# Patient Record
Sex: Female | Born: 1970 | Race: White | Hispanic: No | Marital: Married | Smoking: Never smoker
Health system: Southern US, Community
[De-identification: ages and names within clinical notes are randomized; demographics above are authoritative.]

---

## 2020-02-22 ENCOUNTER — Ambulatory Visit: Payer: Self-pay | Attending: Internal Medicine

## 2020-02-22 DIAGNOSIS — Z23 Encounter for immunization: Secondary | ICD-10-CM | POA: Insufficient documentation

## 2020-02-22 NOTE — Progress Notes (Signed)
   Covid-19 Vaccination Clinic  Name:  Kamri Gotsch    MRN: [id number] DOB: [birth year]  02/22/2020  Ms. Ellerbrock was observed post Covid-19 immunization for 15 minutes without incident. She was provided with Vaccine Information Sheet and instruction to access the V-Safe system.   Ms. Kinzie was instructed to call 911 with any severe reactions post vaccine: Marland Kitchen Difficulty breathing  . Swelling of face and throat  . A fast heartbeat  . A bad rash all over body  . Dizziness and weakness   Immunizations Administered    Name Date Dose VIS Date Route   Pfizer COVID-19 Vaccine 02/22/2020 12:16 PM 0.3 mL 11/28/2019 Intramuscular   Manufacturer: [employer], [employer]   Lot: [id number]   NDC: [address]-2

## 2020-03-14 ENCOUNTER — Ambulatory Visit: Payer: Self-pay

## 2021-01-20 ENCOUNTER — Emergency Department (HOSPITAL_COMMUNITY): Payer: BC Managed Care – PPO

## 2021-01-20 ENCOUNTER — Encounter (HOSPITAL_COMMUNITY): Payer: Self-pay | Admitting: Emergency Medicine

## 2021-01-20 ENCOUNTER — Emergency Department (HOSPITAL_COMMUNITY)
Admission: EM | Admit: 2021-01-20 | Discharge: 2021-01-20 | Disposition: A | Discharge status: Home / Self Care | Payer: BC Managed Care – PPO | Attending: Emergency Medicine | Admitting: Emergency Medicine

## 2021-01-20 DIAGNOSIS — R109 Unspecified abdominal pain: Secondary | ICD-10-CM | POA: Diagnosis not present

## 2021-01-20 DIAGNOSIS — R079 Chest pain, unspecified: Secondary | ICD-10-CM

## 2021-01-20 DIAGNOSIS — R110 Nausea: Secondary | ICD-10-CM | POA: Diagnosis not present

## 2021-01-20 DIAGNOSIS — R61 Generalized hyperhidrosis: Secondary | ICD-10-CM | POA: Insufficient documentation

## 2021-01-20 DIAGNOSIS — R0602 Shortness of breath: Secondary | ICD-10-CM | POA: Insufficient documentation

## 2021-01-20 DIAGNOSIS — R072 Precordial pain: Secondary | ICD-10-CM | POA: Insufficient documentation

## 2021-01-20 LAB — COMPREHENSIVE METABOLIC PANEL
ALT: 18 U/L (ref 0–44)
AST: 17 U/L (ref 15–41)
Albumin: 4.1 g/dL (ref 3.5–5.0)
Alkaline Phosphatase: 53 U/L (ref 38–126)
Anion gap: 7 (ref 5–15)
BUN: 22 mg/dL — ABNORMAL HIGH (ref 6–20)
CO2: 25 mmol/L (ref 22–32)
Calcium: 9.7 mg/dL (ref 8.9–10.3)
Chloride: 104 mmol/L (ref 98–111)
Creatinine, Ser: 0.71 mg/dL (ref 0.44–1.00)
GFR, Estimated: >60 mL/min (ref >60)
Glucose, Bld: 100 mg/dL — ABNORMAL HIGH (ref 70–99)
Potassium: 3.8 mmol/L (ref 3.5–5.1)
Sodium: 136 mmol/L (ref 135–145)
Total Bilirubin: 0.5 mg/dL (ref 0.3–1.2)
Total Protein: 6.9 g/dL (ref 6.5–8.1)

## 2021-01-20 LAB — CBC
HCT: 39 % (ref 36.0–46.0)
Hemoglobin: 13.1 g/dL (ref 12.0–15.0)
MCH: 33.2 pg (ref 26.0–34.0)
MCHC: 33.6 g/dL (ref 30.0–36.0)
MCV: 98.7 fL (ref 80.0–100.0)
Platelets: 270 10*3/uL (ref 150–400)
RBC: 3.95 MIL/uL (ref 3.87–5.11)
RDW: 12.4 % (ref 11.5–15.5)
WBC: 10 10*3/uL (ref 4.0–10.5)
nRBC: 0 % (ref 0.0–0.2)

## 2021-01-20 LAB — TROPONIN I (HIGH SENSITIVITY)
Troponin I (High Sensitivity): 3 ng/L (ref <18)
Troponin I (High Sensitivity): 6 ng/L (ref <18)

## 2021-01-20 LAB — LIPASE, BLOOD: Lipase: 35 U/L (ref 11–51)

## 2021-01-20 MED ORDER — HYDROMORPHONE HCL 1 MG/ML IJ SOLN
1.0000 mg | Freq: Once | INTRAMUSCULAR | Status: AC
  Administered 2021-01-20: 1 mg via INTRAVENOUS
  Filled 2021-01-20: qty 1

## 2021-01-20 MED ORDER — ONDANSETRON HCL 4 MG/2ML IJ SOLN
4.0000 mg | Freq: Once | INTRAMUSCULAR | Status: AC
  Administered 2021-01-20: 4 mg via INTRAVENOUS
  Filled 2021-01-20: qty 2

## 2021-01-20 MED ORDER — NITROGLYCERIN 0.4 MG SL SUBL: 0.4000 mg | SUBLINGUAL_TABLET | SUBLINGUAL | 0 refills | Status: AC | PRN

## 2021-01-20 MED ORDER — MORPHINE SULFATE (PF) 4 MG/ML IV SOLN
4.0000 mg | Freq: Once | INTRAVENOUS | Status: AC
  Administered 2021-01-20: 4 mg via INTRAVENOUS
  Filled 2021-01-20: qty 1

## 2021-01-20 MED ORDER — LORAZEPAM 2 MG/ML IJ SOLN
1.0000 mg | Freq: Once | INTRAMUSCULAR | Status: AC
  Administered 2021-01-20: 1 mg via INTRAVENOUS
  Filled 2021-01-20: qty 1

## 2021-01-20 MED ORDER — NITROGLYCERIN 0.4 MG SL SUBL
0.4000 mg | SUBLINGUAL_TABLET | SUBLINGUAL | Status: DC | PRN
Start: 2021-01-20 — End: 2021-01-20
  Administered 2021-01-20 (×3): 0.4 mg via SUBLINGUAL
  Filled 2021-01-20: qty 1

## 2021-01-20 MED ORDER — IOHEXOL 350 MG/ML SOLN
75.0000 mL | Freq: Once | INTRAVENOUS | Status: AC | PRN
  Administered 2021-01-20: 75 mL via INTRAVENOUS

## 2021-01-20 NOTE — Discharge Instructions (Signed)
The testing today does not show that you are having heart attack.  Consider following up with your cardiologist or a different one for a second opinion, for further testing and treatment as needed.

## 2021-01-20 NOTE — ED Triage Notes (Signed)
Pt complaining of sharp substernal chest pain with radiation to her right arm, with nausea diaphoresis, and shortness of breath. Hx of spontaneous right coronary artery dissection. Last one was feb of last year. Pt lifted patient 25 pound child. 325 Asprin and two nitro that did not help chest pain (pt believes that the pills are old) EMS gave her 1 nitro and had HA after with burning under tongue. Pt pain is currently a 4 on a scale of 0 to 10.

## 2021-01-20 NOTE — ED Provider Notes (Signed)
[facility] EMERGENCY DEPARTMENT Provider Note   CSN: [id number] Arrival date & time: 01/20/21  1004     History Chief Complaint  Patient presents with  . Chest Pain    Annette Parker is a 50 y.o. female.  She has a history of coronary artery dissection and MI.  This occurred about 2 years ago.  She is complaining of acute onset of substernal sharp chest pain with radiation to right arm associated with nausea diaphoresis shortness of breath similar to that prior attack.  Initially 10 out of 10 now 4 out of 10 after aspirin and 1 nitroglycerin.  Cardiac catheterization 1 year ago without significant disease.  Usually follows at [facility].  Today's episode occurred after lifting a 25 pound child similar to prior episode.   The history is provided by the patient and the EMS personnel.  Chest Pain Pain location:  Substernal area Pain quality: sharp and stabbing   Pain radiates to:  R arm Pain severity:  Severe Onset quality:  Sudden Duration:  1 hour Timing:  Intermittent Progression:  Improving Chronicity:  Recurrent Context: lifting   Relieved by:  None tried Worsened by:  Nothing Ineffective treatments:  Aspirin and nitroglycerin Associated symptoms: abdominal pain, diaphoresis and shortness of breath   Associated symptoms: no back pain, no cough, no dysphagia, no fever, no heartburn, no numbness, no syncope, no vomiting and no weakness   Risk factors: no smoking     HPI: A 51 year old patient presents for evaluation of chest pain. Initial onset of pain was approximately 1-3 hours ago. The patient's chest pain is sharp and is not worse with exertion. The patient reports some diaphoresis. The patient's chest pain is middle- or left-sided, is not well-localized, is not described as heaviness/pressure/tightness and does radiate to the arms/jaw/neck. The patient does not complain of nausea. The patient has no history of stroke, has no history of peripheral artery disease, has not  smoked in the past 90 days, denies any history of treated diabetes, has no relevant family history of coronary artery disease (first degree relative at less than age 26), is not hypertensive, has no history of hypercholesterolemia and does not have an elevated BMI (>=30).   History reviewed. No pertinent past medical history.  There are no problems to display for this patient.   History reviewed. No pertinent surgical history.   OB History   No obstetric history on file.     History reviewed. No pertinent family history.  Social History   Tobacco Use  . Smoking status: Never Smoker  . Smokeless tobacco: Never Used    Home Medications Prior to Admission medications   Not on File    Allergies    Patient has no allergy information on record.  Review of Systems   Review of Systems  Constitutional: Positive for diaphoresis. Negative for fever.  HENT: Negative for sore throat and trouble swallowing.   Eyes: Negative for visual disturbance.  Respiratory: Positive for shortness of breath. Negative for cough.   Cardiovascular: Positive for chest pain. Negative for syncope.  Gastrointestinal: Positive for abdominal pain. Negative for heartburn and vomiting.  Genitourinary: Negative for dysuria.  Musculoskeletal: Negative for back pain.  Skin: Negative for rash.  Neurological: Negative for weakness and numbness.    Physical Exam Updated Vital Signs BP 123/87   Pulse 87   Resp 17   Ht 5\' 8"  (1.727 m)   Wt 74.8 kg   SpO2 100%   BMI 25.09 kg/m  Physical Exam Vitals and nursing note reviewed.  Constitutional:      General: She is not in acute distress.    Appearance: She is well-developed and well-nourished.  HENT:     Head: Normocephalic and atraumatic.  Eyes:     Conjunctiva/sclera: Conjunctivae normal.  Cardiovascular:     Rate and Rhythm: Normal rate and regular rhythm.     Heart sounds: Normal heart sounds. No murmur heard.   Pulmonary:     Effort:  Pulmonary effort is normal. No respiratory distress.     Breath sounds: Normal breath sounds.  Abdominal:     Palpations: Abdomen is soft.     Tenderness: There is no abdominal tenderness.  Musculoskeletal:        General: No edema. Normal range of motion.     Cervical back: Neck supple.     Right lower leg: No tenderness. No edema.     Left lower leg: No tenderness. No edema.  Skin:    General: Skin is warm and dry.     Capillary Refill: Capillary refill takes less than 2 seconds.  Neurological:     General: No focal deficit present.     Mental Status: She is alert.  Psychiatric:        Mood and Affect: Mood and affect normal.     ED Results / Procedures / Treatments   Labs (all labs ordered are listed, but only abnormal results are displayed) Labs Reviewed  COMPREHENSIVE METABOLIC PANEL - Abnormal; Notable for the following components:      Result Value   Glucose, Bld 100 (*)    BUN 22 (*)    All other components within normal limits  CBC  LIPASE, BLOOD  TROPONIN I (HIGH SENSITIVITY)  TROPONIN I (HIGH SENSITIVITY)    EKG EKG Interpretation  Date/Time:  Thursday January 20 2021 10:11:25 EST Ventricular Rate:  91 PR Interval:    QRS Duration: 84 QT Interval:  355 QTC Calculation: 437 R Axis:   54 Text Interpretation: Sinus rhythm Inferior infarct, age indeterminate Baseline wander in lead(s) II No old tracing to compare Confirmed by Meridee Score [phone]) on 01/20/2021 10:18:07 AM   Radiology CT Angio Chest PE W/Cm &/Or Wo Cm  Result Date: 01/20/2021 CLINICAL DATA:  Suspected PE in this 50 year old female. EXAM: CT ANGIOGRAPHY CHEST WITH CONTRAST TECHNIQUE: Multidetector CT imaging of the chest was performed using the standard protocol during bolus administration of intravenous contrast. Multiplanar CT image reconstructions and MIPs were obtained to evaluate the vascular anatomy. CONTRAST:  75mL OMNIPAQUE IOHEXOL 350 MG/ML SOLN COMPARISON:  Chest x-ray from January 20, 2021 FINDINGS: Cardiovascular: Normal heart size. No pericardial effusion. Aorta 3.3 cm maximal caliber. Central pulmonary vasculature normal caliber. Pulmonary arterial opacification adequate for evaluation. No evidence of pulmonary embolism. Mediastinum/Nodes: Esophagus grossly normal. No mediastinal adenopathy. No hilar adenopathy. No axillary lymphadenopathy. Lungs/Pleura: Basilar atelectasis. Airways are patent. No consolidation. No pleural effusion. Upper Abdomen: Incidental imaging of the upper abdomen without acute process. Imaged portions of liver show a tiny hypodensity likely a cyst. Imaged portions of pancreas, spleen and adrenal glands are unremarkable as are upper portions of the kidneys and the visualized gastrointestinal tract. Musculoskeletal: No acute musculoskeletal process. No destructive bone finding. Review of the MIP images confirms the above findings. IMPRESSION: 1. Negative for pulmonary embolism. 2. 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 Recommend annual imaging followup by CTA or MRA. This recommendation follows 2010 ACCF/AHA/AATS/ACR/ASA/SCA/SCAI/SIR/STS/SVM Guidelines for the Diagnosis and Management of Patients  with Thoracic Aortic Disease. Circulation.2010; 121: U981-X914. Aortic aneurysm NOS (ICD10-I71.9) 3. Basilar atelectasis. Electronically Signed   By: Donzetta Kohut M.D.   On: 01/20/2021 15:51   DG Chest Port 1 View  Result Date: 01/20/2021 CLINICAL DATA:  Chest pain. EXAM: PORTABLE CHEST 1 VIEW COMPARISON:  None. FINDINGS: The heart size and mediastinal contours are within normal limits. Both lungs are clear. No pneumothorax or pleural effusion is noted. The visualized skeletal structures are unremarkable. IMPRESSION: No active disease. Electronically Signed   By: Lupita Raider M.D.   On: 01/20/2021 10:35    Procedures Procedures   Medications Ordered in ED Medications  nitroGLYCERIN (NITROSTAT) SL tablet 0.4 mg (0.4 mg Sublingual Given 01/20/21 1106)  morphine 4 MG/ML  injection 4 mg (4 mg Intravenous Given 01/20/21 1025)  ondansetron (ZOFRAN) injection 4 mg (4 mg Intravenous Given 01/20/21 1025)  HYDROmorphone (DILAUDID) injection 1 mg (1 mg Intravenous Given 01/20/21 1227)  iohexol (OMNIPAQUE) 350 MG/ML injection 75 mL (75 mLs Intravenous Contrast Given 01/20/21 1534)  ondansetron (ZOFRAN) injection 4 mg (4 mg Intravenous Given 01/20/21 1646)  LORazepam (ATIVAN) injection 1 mg (1 mg Intravenous Given 01/20/21 1646)    ED Course  I have reviewed the triage vital signs and the nursing notes.  Pertinent labs & imaging results that were available during my care of the patient were reviewed by me and considered in my medical decision making (see chart for details).  Clinical Course as of 01/20/21 2010  Thu Jan 20, 2021  1039 Chest x-ray interpreted by me as no acute infiltrates. [MB]  1132 Discussed with Dr. Diona Browner cardiology for [facility] who recommended I try to reach the patient's cardiologist at [facility]. [MB]  1200 Reached out to patient's primary cardiologist Dr. Titus Mould at [phone].  His nurse is going to give him the message to have him call me back. [MB]  1435 Discussed with Dr. Titus Mould.  I reviewed her complaints and work-up with him.  He is reassured that her troponins are flat.  He does not think she needs to be transferred to [facility].  Recommended close follow-up with her cardiologist [place] or him at [facility]  Reviewed with patient.  She still having pain and is still nervous that this is her dissection again.  I will see if Dr. Diona Browner is willing to see the patient [MB]  1450 Patient denies any chance of pregnancy.  Will order CT PE make sure she does not have any other etiology of chest pain. [MB]    Clinical Course User Index [MB] Terrilee Files, MD   MDM Rules/Calculators/A&P HEAR Score: 4                       This patient complains of substernal chest pain epigastric pain shortness of breath diaphoresis radiation to right arm; this  involves an extensive number of treatment Options and is a complaint that carries with it a high risk of complications and Morbidity. The differential includes ACS, coronary dissection, musculoskeletal, GERD, PE, dissection, pneumothorax I ordered, reviewed and interpreted labs, which included CBC with normal white count normal hemoglobin, chemistries normal, LFTs normal, troponins flat I ordered medication IV pain medication and nausea medication, sublingual nitro I ordered imaging studies which included chest x-ray and I independently    visualized and interpreted imaging which showed no acute findings Additional history obtained from EMS and patient's husband Previous records obtained and reviewed in epic including recent cath report from a  year ago showing noncritical disease I consulted patient's cardiologist Dr. Titus Mould and discussed lab and imaging findings  Critical Interventions: None  After the interventions stated above, I reevaluated the patient and found patient still to be symptomatic although appears more comfortable.  Signed out to oncoming provider Dr. Effie Shy to follow-up on patient's CT angio chest and reassess if stable for discharge.  Anticipate she will need follow-up with her outpatient providers.   Final Clinical Impression(s) / ED Diagnoses Final diagnoses:  Nonspecific chest pain    Rx / DC Orders ED Discharge Orders         Ordered    nitroGLYCERIN (NITROSTAT) 0.4 MG SL tablet  Every 5 min PRN        01/20/21 1733           Terrilee Files, MD 01/20/21 2015

## 2021-01-20 NOTE — ED Notes (Signed)
Pt given 3 SL NTG as ordered. Pt pain decreased from 10/10 to 6/10. Pt states pain is still there but not as intense as before.

## 2021-01-20 NOTE — ED Provider Notes (Signed)
3:45 PM-checkout from Dr. Charm Barges to evaluate imaging after return for consideration of aortic abnormality as etiology for her chest discomfort.  Patient is being evaluated for chest pain with shortness of breath.  He has had prior myocardial infarction associated with coronary artery dissection.  4:20 PM-CTA returned, no acute aortic abnormality.  Small aortic aneurysm is present.  Patient alert and cooperative.  She appears uncomfortable.  She reports that she still has pain, in her chest, and has had some episodes of pain in her right arm, upper abdomen and in her upper back, and is "lightning feeling."  Currently patient is intermittently gagging and vomiting, clear material, into a bag.  She requests more medication.  5:30 PM-patient now sitting up, looking more comfortable, eating crackers and drinking fluids without problems.  I discussed the findings with the patient and her husband.  She relates that she has had multiple episodes similar to this when she has pain after exertion, it typically hurts for 2 weeks afterwards.  She continues to be worried about her heart status.  I explained that I do not think that she is having a STEMI but she does have pre-existing coronary artery disease, a 50% narrowing of her RCA, that she states she did not know about.  I recommended follow-up with her cardiologist.  She stated that she would do that or see a new cardiologist for second opinion.  She requested a refill of her nitroglycerin which she takes when she has pains like this.  Medical decision making-patient with nonspecific chest pain, and reassuring ED evaluation.  She has mild coronary disease and history of spontaneous coronary artery dissection, 2 years ago.  She clearly has a overlay of anxiety about her status, and will likely need to follow-up with cardiology for further evaluation and possibly more intervention.  At this time I think she would benefit from hospitalization.  She is stable for  discharge.  I have instructed her to continue her usual medication regimen.   Mancel Bale, MD 01/20/21 [phone]

## 2021-12-31 IMAGING — CT CT ANGIO CHEST
2 of 6 series · 19 of 46 positions shown · IV contrast (Omnipaque or Isovue)
Comparison: Chest x-ray from January 20, 2021

CLINICAL DATA: Suspected PE in this 49-year-old female.

EXAM:
CT ANGIOGRAPHY CHEST WITH CONTRAST
TECHNIQUE: Multidetector CT imaging of the chest was performed using the
standard protocol during bolus administration of intravenous
contrast. Multiplanar CT image reconstructions and MIPs were
obtained to evaluate the vascular anatomy.
CONTRAST:  75mL OMNIPAQUE IOHEXOL 350 MG/ML SOLN

[Series 5: pe axial thins · axial · 0.67mm/px · z∈[+939,+1225]mm · 16 of 314 slices shown]
[im 14/314  lung]
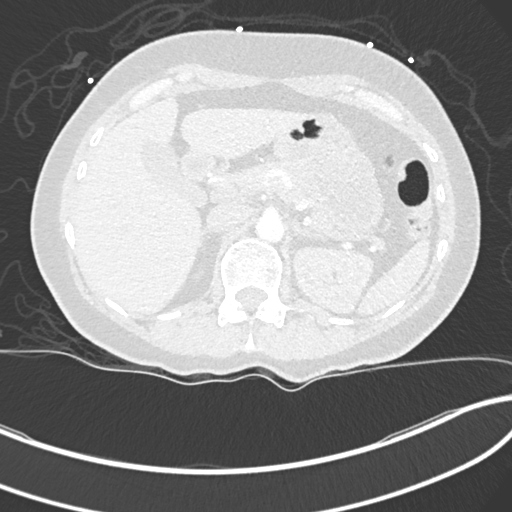
[im 41/314  soft-tissue]
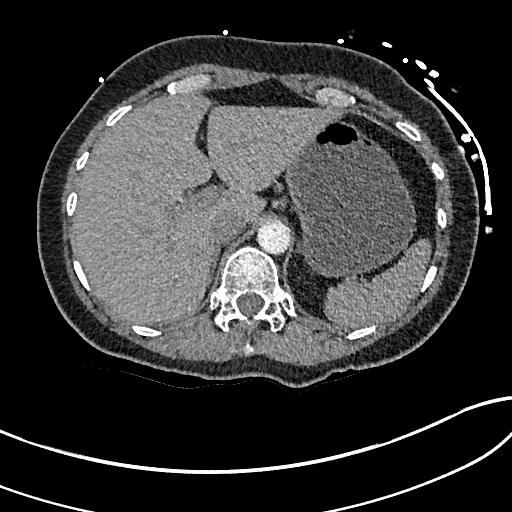
[im 55/314  lung]
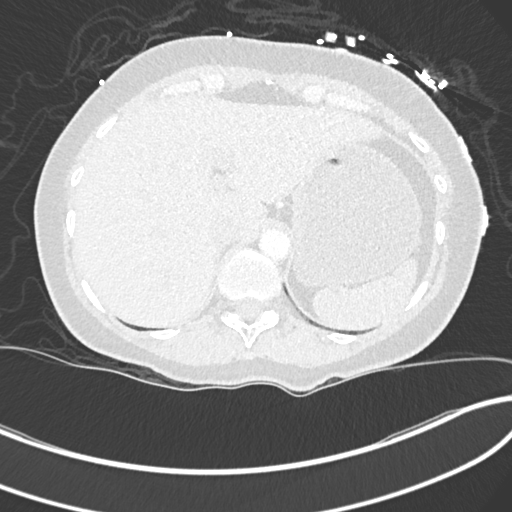
[im 69/314  soft-tissue]
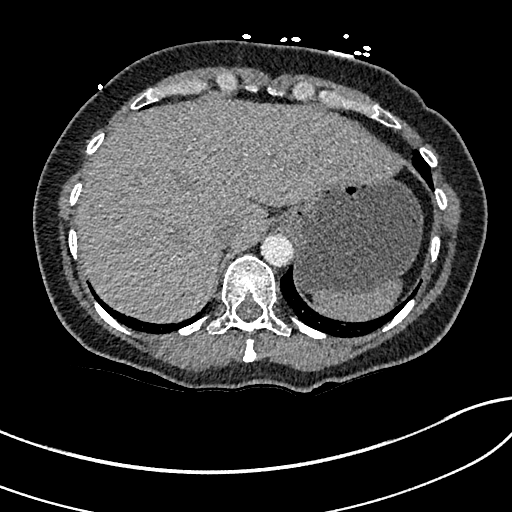
[im 96/314  lung]
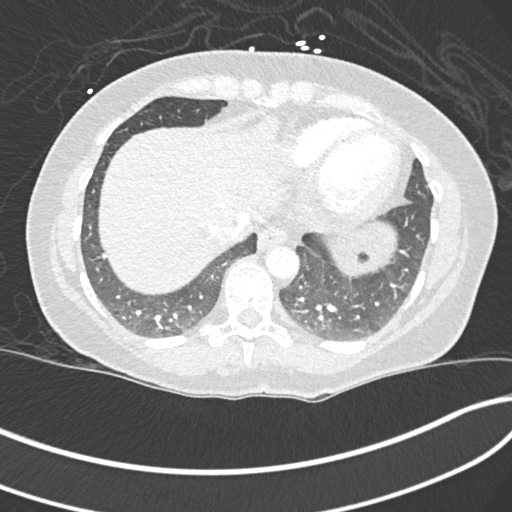
[im 109/314  soft-tissue]
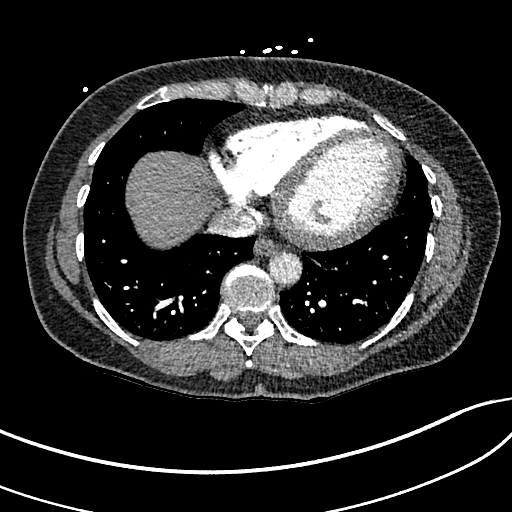
[im 123/314  lung]
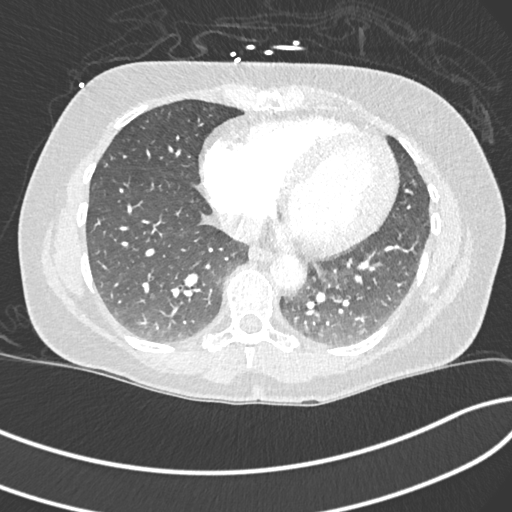
[im 150/314  soft-tissue]
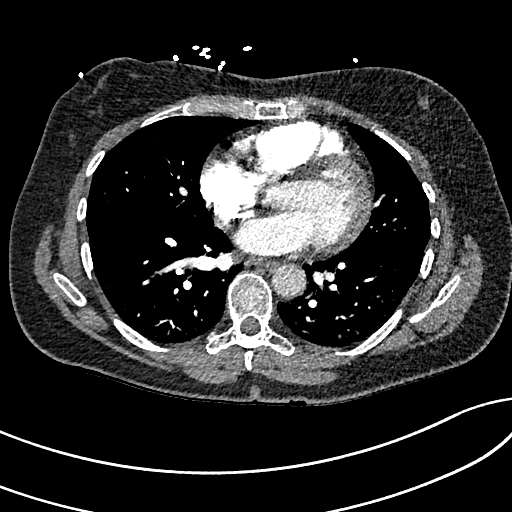
[im 164/314  lung]
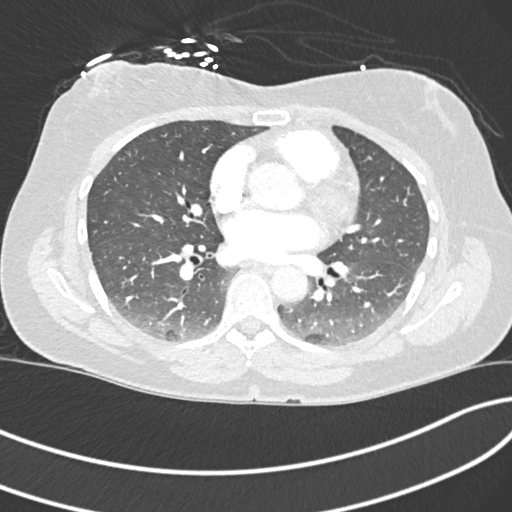
[im 191/314  soft-tissue]
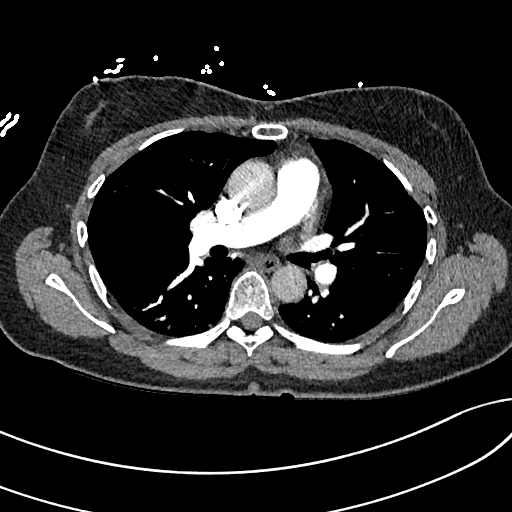
[im 205/314  lung]
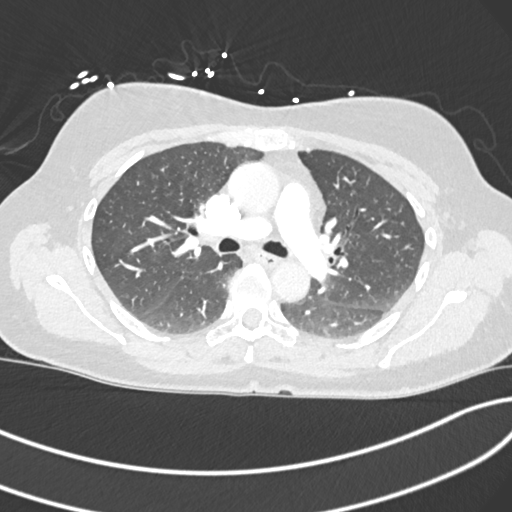
[im 218/314  soft-tissue]
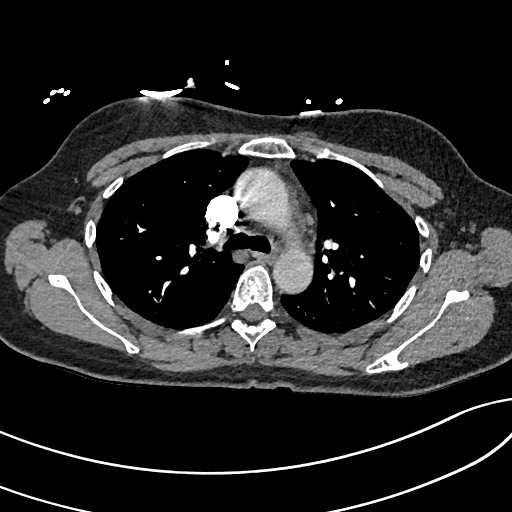
[im 245/314  lung]
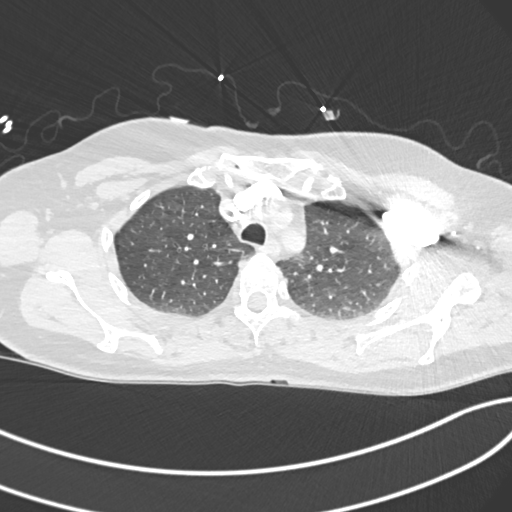
[im 259/314  soft-tissue]
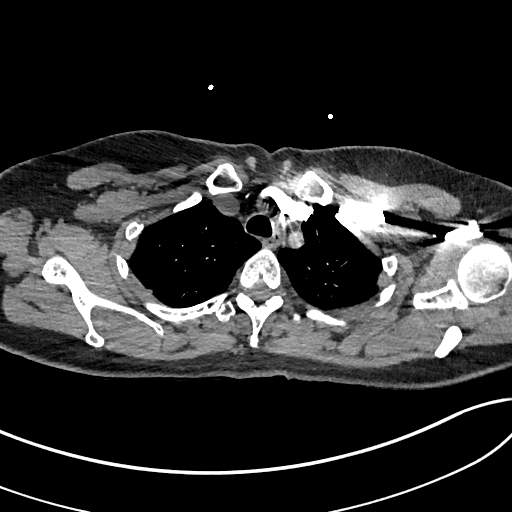
[im 273/314  lung]
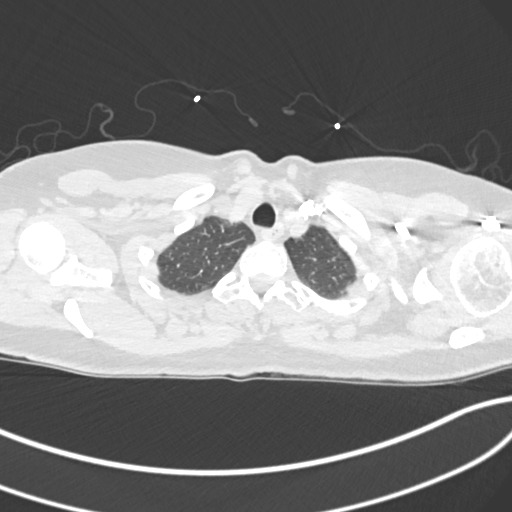
[im 300/314  soft-tissue]
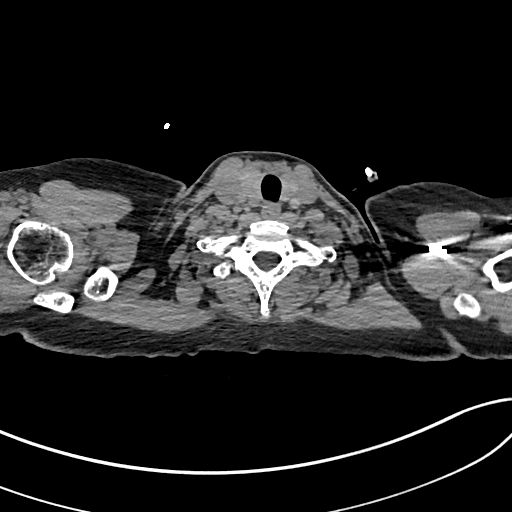

[Series 7: cor soft · coronal · 0.62mm/px · 3 of 145 slices shown]
[im 37/145  soft-tissue]
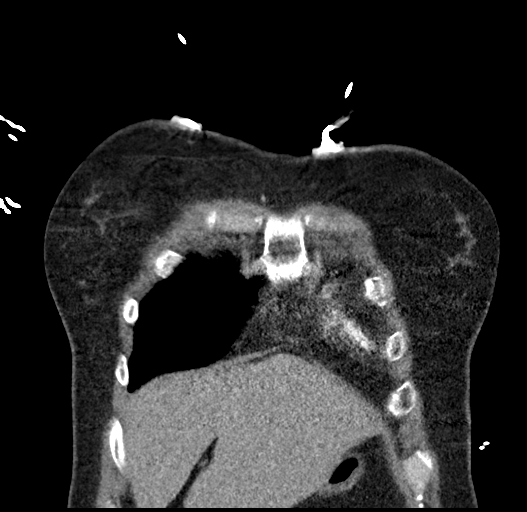
[im 73/145  soft-tissue]
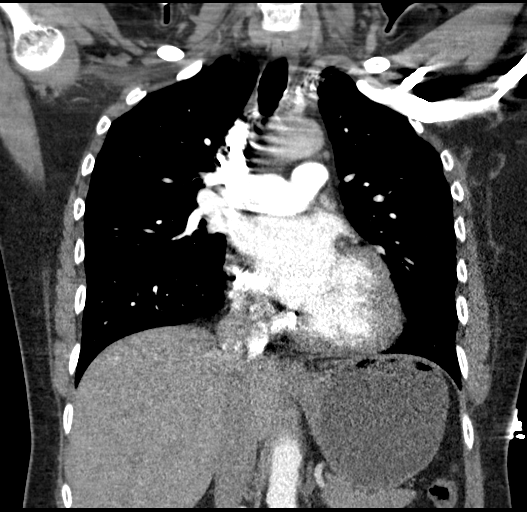
[im 109/145  soft-tissue]
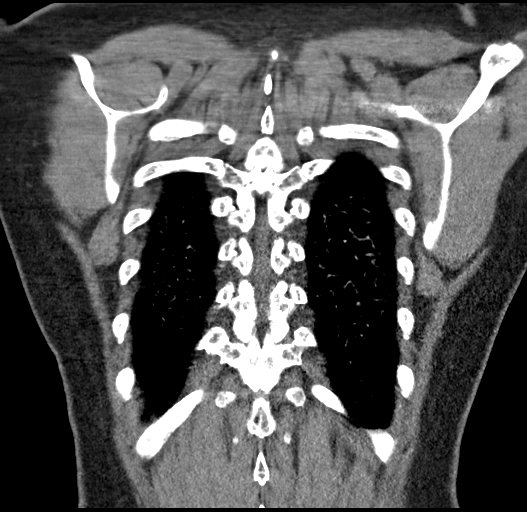

[19 of 46 positions shown; findings below may reference images not displayed]

FINDINGS: Cardiovascular: Normal heart size. No pericardial effusion. Aorta
3.3 cm maximal caliber. Central pulmonary vasculature normal
caliber. Pulmonary arterial opacification adequate for evaluation.
No evidence of pulmonary embolism.

Mediastinum/Nodes: Esophagus grossly normal.

No mediastinal adenopathy. No hilar adenopathy. No axillary
lymphadenopathy.

Lungs/Pleura: Basilar atelectasis. Airways are patent. No
consolidation. No pleural effusion.

Upper Abdomen: Incidental imaging of the upper abdomen without acute
process. Imaged portions of liver show a tiny hypodensity likely a
cyst. Imaged portions of pancreas, spleen and adrenal glands are
unremarkable as are upper portions of the kidneys and the visualized
gastrointestinal tract.

Musculoskeletal: No acute musculoskeletal process. No destructive
bone finding.

Review of the MIP images confirms the above findings.
IMPRESSION: 1. Negative for pulmonary embolism.
2. Mild aortic dilation to 3.3 cm. Recommend annual imaging followup
by CTA or MRA. This recommendation follows 7999
ACCF/AHA/AATS/ACR/ASA/SCA/BANCA RIIVERA/TER/LECLERCQ/VELCIUG Guidelines for the
Diagnosis and Management of Patients with Thoracic Aortic Disease.
Circulation.7999; 121: E266-e369. Aortic aneurysm NOS ([id number])
3. Basilar atelectasis.

## 2021-12-31 IMAGING — DX DG CHEST 1V PORT
1 series · 1 of 1 positions shown · non-contrast
Comparison: None.

CLINICAL DATA: Chest pain.

EXAM:
PORTABLE CHEST 1 VIEW

[chest ap]
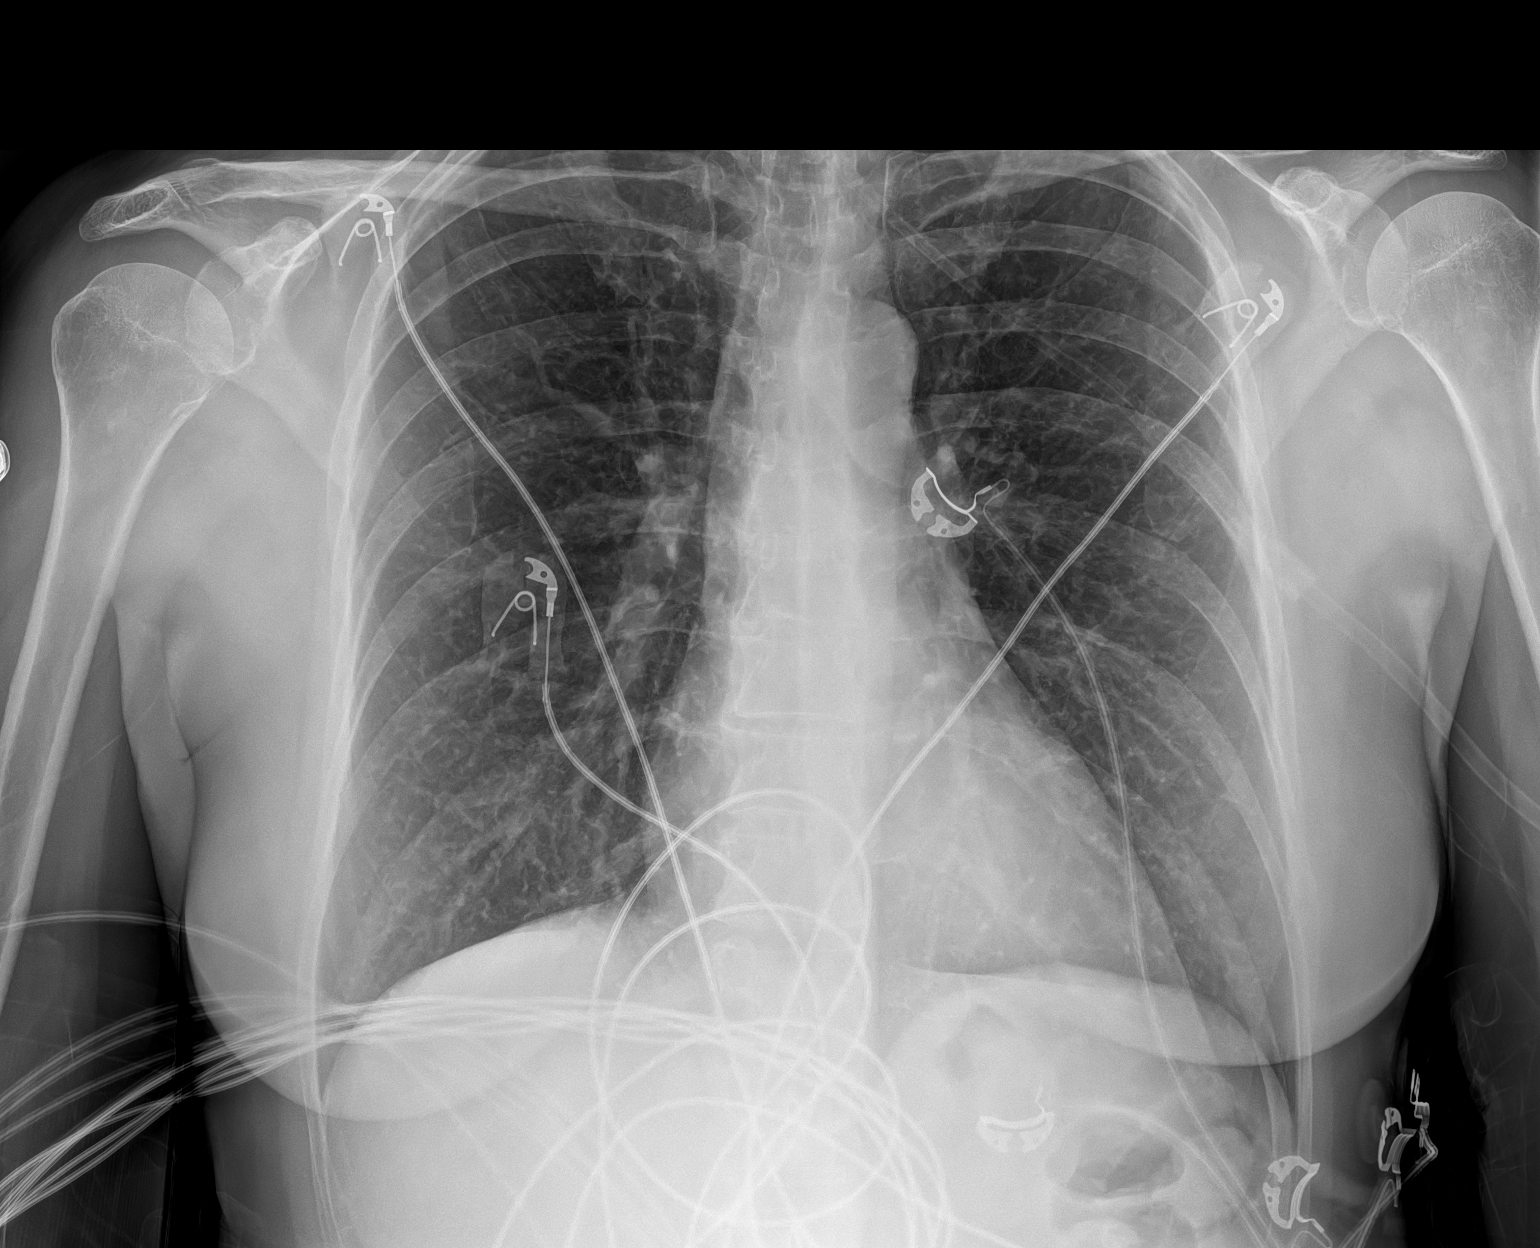

[1 of 1 positions shown; findings below may reference images not displayed]

FINDINGS: The heart size and mediastinal contours are within normal limits.
Both lungs are clear. No pneumothorax or pleural effusion is noted.
The visualized skeletal structures are unremarkable.
IMPRESSION: No active disease.
